# Patient Record
Sex: Male | Born: 1935 | Race: White | Hispanic: No | Marital: Single | Smoking: Never smoker
Health system: Southern US, Community
[De-identification: ages and names within clinical notes are randomized; demographics above are authoritative.]

## PROBLEM LIST (undated history)

## (undated) DIAGNOSIS — I10 Essential (primary) hypertension: Secondary | ICD-10-CM

## (undated) DIAGNOSIS — R0789 Other chest pain: Secondary | ICD-10-CM

## (undated) DIAGNOSIS — R000 Tachycardia, unspecified: Secondary | ICD-10-CM

## (undated) DIAGNOSIS — E7800 Pure hypercholesterolemia, unspecified: Secondary | ICD-10-CM

## (undated) DIAGNOSIS — H409 Unspecified glaucoma: Secondary | ICD-10-CM

## (undated) DIAGNOSIS — M1990 Unspecified osteoarthritis, unspecified site: Secondary | ICD-10-CM

## (undated) HISTORY — DX: Tachycardia, unspecified: R00.0

## (undated) HISTORY — DX: Pure hypercholesterolemia, unspecified: E78.00

## (undated) HISTORY — PX: APPENDECTOMY: SHX54

## (undated) HISTORY — DX: Essential (primary) hypertension: I10

## (undated) HISTORY — DX: Other chest pain: R07.89

## (undated) HISTORY — DX: Unspecified glaucoma: H40.9

## (undated) HISTORY — PX: HAND SURGERY: SHX662

## (undated) HISTORY — PX: TESTICLE REMOVAL: SHX68

## (undated) HISTORY — DX: Unspecified osteoarthritis, unspecified site: M19.90

---

## 1998-06-19 ENCOUNTER — Ambulatory Visit (HOSPITAL_COMMUNITY): Admission: RE | Admit: 1998-06-19 | Discharge: 1998-06-19 | Payer: Self-pay | Admitting: Gastroenterology

## 1998-08-08 ENCOUNTER — Ambulatory Visit (HOSPITAL_COMMUNITY): Admission: RE | Admit: 1998-08-08 | Discharge: 1998-08-08 | Payer: Self-pay | Admitting: Urology

## 1999-06-20 ENCOUNTER — Encounter: Admission: RE | Admit: 1999-06-20 | Discharge: 1999-06-20 | Payer: Self-pay | Admitting: Urology

## 1999-06-20 ENCOUNTER — Encounter: Payer: Self-pay | Admitting: Urology

## 1999-09-18 ENCOUNTER — Encounter: Admission: RE | Admit: 1999-09-18 | Discharge: 1999-09-18 | Payer: Self-pay | Admitting: Urology

## 1999-09-18 ENCOUNTER — Encounter: Payer: Self-pay | Admitting: Urology

## 1999-12-31 ENCOUNTER — Encounter: Admission: RE | Admit: 1999-12-31 | Discharge: 1999-12-31 | Payer: Self-pay | Admitting: Urology

## 1999-12-31 ENCOUNTER — Encounter: Payer: Self-pay | Admitting: Urology

## 2000-07-07 ENCOUNTER — Encounter: Payer: Self-pay | Admitting: Urology

## 2000-07-07 ENCOUNTER — Encounter: Admission: RE | Admit: 2000-07-07 | Discharge: 2000-07-07 | Payer: Self-pay | Admitting: Urology

## 2001-01-12 ENCOUNTER — Encounter: Admission: RE | Admit: 2001-01-12 | Discharge: 2001-01-12 | Payer: Self-pay | Admitting: Urology

## 2001-01-12 ENCOUNTER — Encounter: Payer: Self-pay | Admitting: Urology

## 2001-07-20 ENCOUNTER — Encounter: Payer: Self-pay | Admitting: Urology

## 2001-07-20 ENCOUNTER — Encounter: Admission: RE | Admit: 2001-07-20 | Discharge: 2001-07-20 | Payer: Self-pay | Admitting: Urology

## 2002-08-25 ENCOUNTER — Encounter (INDEPENDENT_AMBULATORY_CARE_PROVIDER_SITE_OTHER): Payer: Self-pay | Admitting: Specialist

## 2002-08-25 ENCOUNTER — Ambulatory Visit (HOSPITAL_BASED_OUTPATIENT_CLINIC_OR_DEPARTMENT_OTHER): Admission: RE | Admit: 2002-08-25 | Discharge: 2002-08-25 | Payer: Self-pay | Admitting: Orthopedic Surgery

## 2003-10-03 ENCOUNTER — Inpatient Hospital Stay (HOSPITAL_BASED_OUTPATIENT_CLINIC_OR_DEPARTMENT_OTHER): Admission: RE | Admit: 2003-10-03 | Discharge: 2003-10-03 | Payer: Self-pay | Admitting: Cardiology

## 2004-07-09 ENCOUNTER — Encounter (INDEPENDENT_AMBULATORY_CARE_PROVIDER_SITE_OTHER): Payer: Self-pay | Admitting: *Deleted

## 2004-07-09 ENCOUNTER — Ambulatory Visit (HOSPITAL_BASED_OUTPATIENT_CLINIC_OR_DEPARTMENT_OTHER): Admission: RE | Admit: 2004-07-09 | Discharge: 2004-07-09 | Payer: Self-pay | Admitting: Orthopedic Surgery

## 2004-07-09 ENCOUNTER — Ambulatory Visit (HOSPITAL_COMMUNITY): Admission: RE | Admit: 2004-07-09 | Discharge: 2004-07-09 | Payer: Self-pay | Admitting: Orthopedic Surgery

## 2008-06-27 HISTORY — PX: CARDIOVASCULAR STRESS TEST: SHX262

## 2010-01-08 ENCOUNTER — Encounter: Admission: RE | Admit: 2010-01-08 | Discharge: 2010-01-08 | Payer: Self-pay | Admitting: Urology

## 2010-08-30 NOTE — H&P (Signed)
NAME:  Joshua Santiago, Joshua Santiago                        ACCOUNT NO.:  [id number]   MEDICAL RECORD NO.:  [id number]                   PATIENT TYPE:  OIB   LOCATION:  6501                                 FACILITY:  [facility]   PHYSICIAN:  Peter M. Swaziland, M.D.               DATE OF BIRTH:  [birth year]   DATE OF ADMISSION:  10/03/2003  DATE OF DISCHARGE:                                HISTORY & PHYSICAL   HISTORY OF PRESENT ILLNESS:  Mr. Borak is a 77 year old white male who is  seen for evaluation of occasional chest pain.  This pain occurs across the  anterior precordium and is described as a dull ache.  There is no radiation,  nausea, vomiting, sweating or shortness of breath.  Sometimes, these  symptoms occur when he is quiet and at rest and may awaken him at night.  It  does not appear to be associated with exertion.  He reports a prior stress  test in 1996 which was unremarkable.  He does have a history of  hypercholesterolemia and family history of early coronary disease.  The  patient was seen for a stress echo evaluation on September 22, 2003.  He was able  to exercise for 9 minutes on the Bruce protocol and experienced no chest  pain.  His heart rate and blood pressure response were normal.  He had no  significant ST segment changes.  However, his stress echo was abnormal,  showing moderate hypokinesia of the mid-to-distal anterior septum and a  prominent septal bounce.  Because of his abnormal stress echo evaluation, it  was recommended that he undergo coronary angiography.   PAST MEDICAL HISTORY:  1. Hypothyroidism.  2. History of mesothelioma requiring removal of a testicle.   PAST SURGICAL HISTORY:  His prior surgeries include:  1. Tonsillectomy.  2. Appendectomy.  3. Varicose vein surgery.  4. Repair of Dupuytren's contractures in both hands.   ALLERGIES:  Allergies to PENICILLIN.   CURRENT MEDICATIONS:  1. Synthroid daily.  2. Cosopt eye drops b.i.d.  3. Aspirin 81 mg per  day.  4. Multivitamin daily.   SOCIAL HISTORY:  The patient does some part-time Psychologist, prison and probation services work.  He  denies tobacco use.  He drinks 2 mixed drinks per day.  He is married and  has 2 children.   FAMILY HISTORY:  Father died of suicide.  Mother died of cancer.  One  brother died at age 98 of myocardial infarction.  One brother is alive and  well.   REVIEW OF SYSTEMS:  The patient is fairly sedentary.  He has no history of  TIA or stroke.  No claudication.  No history of edema or PND.  Other review  of systems are negative.   PHYSICAL EXAM:  GENERAL:  On physical exam, the patient is a pleasant white  male in no apparent distress.  VITAL SIGNS:  Blood pressure is 124/80, pulse 70 and  regular, respirations  are normal.  HEENT:  Exam is unremarkable.  NECK:  He has no JVD or bruits.  LUNGS:  Lungs are clear.  CARDIAC:  Exam reveals a regular rate and rhythm without gallop, murmur, rub  or click.  ABDOMEN:  Abdomen is soft and nontender.  He has no hepatosplenomegaly,  masses or bruits.  EXTREMITIES:  Extremities are without edema.  Pulses are 2+ and symmetric.  NEUROLOGIC:  Exam is nonfocal.   LABORATORY DATA:  Chest x-ray shows no active disease.   Resting ECG shows a left anterior fascicular block, otherwise normal.   IMPRESSION:  1. Atypical chest pain but with an abnormal stress echocardiographic study.  2. Hypercholesterolemia.  3. Family history of coronary disease.  4. Hypothyroidism.   PLAN:  We will proceed with coronary angiography.                                                Peter M. Swaziland, M.D.    PMJ/MEDQ  D:  10/02/2003  T:  10/03/2003  Job:  [id number]   cc:   Veverly Fells. Altheimer, M.D.  [address]  [place]  [place] [address]  Fax: [phone]

## 2010-08-30 NOTE — Cardiovascular Report (Signed)
NAME:  Joshua Santiago, Joshua Santiago                        ACCOUNT NO.:  [id number]   MEDICAL RECORD NO.:  [id number]                   PATIENT TYPE:  OIB   LOCATION:  6501                                 FACILITY:  [facility]   PHYSICIAN:  Peter M. Swaziland, M.D.               DATE OF BIRTH:  1929-01-21   DATE OF PROCEDURE:  10/03/2003  DATE OF DISCHARGE:  10/03/2003                              CARDIAC CATHETERIZATION   INDICATION FOR PROCEDURE:  A 77 year old white male with history of atypical  chest pain.  He has history of hypercholesterolemia, family history of  coronary disease and elevated CRP level.  Recent stress echocardiogram was  suggestive of anteroseptal ischemia.   ACCESS:  Via the right femoral artery using standard Seldinger technique.   EQUIPMENT:  6 French 4 cm right and left Judkins catheter, 4 French pigtail  catheter, 4 French arterial sheath.   MEDICATIONS:  Local anesthesia with 1% Xylocaine.   CONTRAST:  100 mL of Omnipaque.   HEMODYNAMIC DATA:  Aortic pressure 126/72 with mean of 94.  Left ventricular  pressure was 137 with EDP of 13 mmHg.   ANGIOGRAPHIC DATA:  The left coronary artery arises and distributes  normally.  The left main coronary artery is normal.   The left anterior descending artery is a large vessel which wraps around the  apex and supplies the distal inferior wall and is also normal.   There is intermediate vessel which is normal.   The left circumflex coronary artery gives rise to two marginal vessels and  it is normal.   The right coronary artery arises at an odd angle.  It was entered with [facility]  catheter.  It is a co-dominant vessel and appears normal.   LEFT VENTRICULAR ANGIOGRAPHY:  In the RAO view demonstrates normal left  ventricular size and contractility with normal systolic function.  Ejection  fraction is estimated at 60%.  There is no mitral regurgitation or prolapse.   FINAL INTERPRETATION:  1. Normal coronary anatomy.  2. Normal  left ventricular function.                                               Peter M. Swaziland, M.D.    PMJ/MEDQ  D:  10/03/2003  T:  10/04/2003  Job:  [id number]   cc:   Veverly Fells. Altheimer, M.D.  [address], [address]  [place]  [place] [address]  Fax: [phone]   Bertram Millard. Dahlstedt, M.D.  [address]  [place]  [place] [address]  Fax: [phone]

## 2010-08-30 NOTE — Op Note (Signed)
NAMEMICHEIL, KLAUS              ACCOUNT NO.:  [id number]   MEDICAL RECORD NO.:  [id number]          PATIENT TYPE:  AMB   LOCATION:  DSC                          FACILITY:  [facility]   PHYSICIAN:  Katy Fitch. Sypher Montez Hageman., M.D.DATE OF BIRTH:  [birth year]   DATE OF PROCEDURE:  07/09/2004  DATE OF DISCHARGE:                                 OPERATIVE REPORT   PREOPERATIVE DIAGNOSIS:  Chronic Dupuytren's contracture right hand  involving the palm, pretendinous fibers to long and ring finger and  extensive nodular disease over P1 and P2 segments of a right ring finger and  a thumb index web space contracture involving primarily the natatory  ligaments at the first web.   POSTOPERATIVE DIAGNOSIS:  Chronic Dupuytren's contracture right hand  involving the palm, pretendinous fibers to long and ring finger and  extensive nodular disease over P1 and P2 segments of a right ring finger and  a thumb index web space contracture involving primarily the natatory  ligaments at the first web.   OPERATION:  1.  Resection of Dupuytren's contracture from right palm with pretendinous      fiber resection to long and ring fingers and resection of extensive      pathologic fascia from the P1 and P2 segments of the right ring finger.  2.  Resection of the natatory ligaments and pathologic fascia from the right      hand first web space.   OPERATING SURGEON:  Katy Fitch. Sypher, M.D.   ASSISTANT:  Marveen Reeks. Dasnoit, P.A.C.   ANESTHESIA:  General by LMA.   SUPERVISING ANESTHESIOLOGIST:  Dr. Claybon Jabs.   INDICATIONS:  Shaune Westfall is a 77 year old gentleman referred by Dr.  Casimiro Needle Altheimer and Peter Swaziland, M.D.  for evaluation and management of a  chronic right-hand Dupuytren's' contracture.   He is status post extensive surgery on the left hand for the same  predicament.   Mr. Cripps  is intimately familiar with the pathology of Dupuytren's'  disease, surgery and aftercare having undergone prior  surgery on several  occasions.   Preoperatively, he presented requesting that we resect the pathologic fascia  from his right ring finger and palm as well as any other potential sites of  involvement.   We advised him to proceed with resection of disease from the palm, ring  finger and first web space.   He fully understands that Dupuytren's' disease progresses over time and that  other nodules and course will form in adjacent fingers and even in the  fingers that have undergone resection.   He understands the potential complications of neurovascular injury, possible  wound infection and recurrence of contracture.   Questions were invited and answered preoperatively.   After informed consent, he is brought to the operating room at this time  anticipating right hand surgery.   PROCEDURE:  Terrilee Files was brought to the operating room and placed  supine position on the operating table.   Following the induction of general anesthesia by LMA under the direct  supervision of Adonis Huguenin, M.D., the right arm was prepped with Betadine  soap solution,  sterilely draped. A pneumatic tourniquet was applied to the  proximal right brachium.   Vancomycin 1 g was slowly administered over 90 minutes in the holding area  and in the preoperative period.   The right arm was then exsanguinated with Esmarch bandage and the arterial  tourniquet on the proximal brachium inflated to 250 mmHg.   Procedure commenced with planning a Brunner zigzag incisions, anticipating  skin removal at the site of the large nodule at the MP flexion crease.   The skin incisions were taken sharply down to the plane of the dermal  Dupuytren's junction.   The skin flaps were carefully elevated with the beaver blade and fine  tenotomy scissors dissection, elevating flaps of the level of the carpal  canal distally to the distal P2 segment of finger.   The common digital nerves and vessels were identified at the  level of the  mid palm and dissected distally with removal of all pathologic fascia  extending from the mid palmar space distally to the level of the A4 pulley.   After complete resection of numerous nodules, spiral bands and central cord  elements, the finger had full extension at the IP joints and 25 degrees of  hyperextension at the MP joint.   All bleeding points were cauterized with bipolar forceps during dissection.  Care was taken to preserve as many cutaneous perforating branches of both  the nerves and vessels.   Attention then directed to the first web space. A transverse incision was  fashioned directly over the palpable cord. Subcutaneous tissues were  carefully divided, taking care to spare the radial proper digital nerve and  artery to the index finger and carefully isolate the natatory ligament  between the thumb and index webspace. A segment measuring more than 1 cm in  length was resected relieving the first web contracture.   The tourniquet released and bleeding controlled by direct pressure and  electrocautery. The wounds were then repaired with corner sutures of 5-0  nylon and vertical and horizontal mattress sutures of 5-0 nylon.   There was 1.5  sq cm of skin excised at  the level of the proximal finger  flexion crease and MP joint with the use of a V-Y advancement flap technique  properly reapportioning skin after resection of the expanded skin due to the  extensive Dupuytren's' nodule formally present.   There was extensive involvement of the dermal portion of the skin at the  level of the proximal finger flexion crease and recurrence at this level  might be a possibility that would require full-thickness skin grafting.   Mr. Telford had immediate capillary refill to his thumb and fingers upon  tourniquet release. Marcaine 0.25% was infiltrated along the wound margins  for postoperative analgesia, taking care to aspirate prior to injection.  The wound was  then dressed with Silvadene, Xeroflo, sterile gauze, sterile  Kerlix and a voluminous dressing with a volar plaster splint stabilizing the  wrist at neutral and the ring finger in extension at the MP joint.   There were no apparent complications. Mr. Amory was transferred to the  recovery room with stable vital signs. He will be discharged home with a  prescription for Levaquin 500 mg one p.o. daily x4 days as a prophylactic  antibiotic. He has Percocet at home and is provided a supplemental  prescription of Motrin 600 mg one p.o. q.6h. p.r.n. pain with food.      RVS/MEDQ  D:  07/09/2004  T:  07/09/2004  Job:  [id number]   cc:   Veverly Fells. Altheimer, M.D.  [address]  [place]  [place] [address]  Fax: [phone]   Peter M. Swaziland, M.D.  [address]  [place], [place] [address]  Fax: [phone]

## 2010-08-30 NOTE — Op Note (Signed)
NAME:  Joshua Santiago, Joshua Santiago                        ACCOUNT NO.:  [id number]   MEDICAL RECORD NO.:  [id number]                   PATIENT TYPE:  AMB   LOCATION:  DSC                                  FACILITY:  [facility]   PHYSICIAN:  Katy Fitch. Naaman Plummer., M.D.          DATE OF BIRTH:  08-Jun-1928   DATE OF PROCEDURE:  08/25/2002  DATE OF DISCHARGE:                                 OPERATIVE REPORT   PREOPERATIVE DIAGNOSIS:  Progressive Dupuytren's contracture, left palm and  small finger with 75 degree flexion contracture of small finger proximal  interphalangeal joint.   POSTOPERATIVE DIAGNOSIS:  Progressive Dupuytren's contracture, left palm and  small finger with 75 degree flexion contracture of small finger proximal  interphalangeal joint.   OPERATION PERFORMED:  Excision of Dupuytren's pathologic fascia from left  small finger and palm, relieving flexion contractures of MP and PIP joints  of left small finger.   SURGEON:  Katy Fitch. Sypher, M.D.   ASSISTANT:  Jonni Sanger, P.A.   ANESTHESIA:  General by LMA.   SUPERVISING ANESTHESIOLOGIST:  Janetta Hora. Gelene Mink, M.D.   INDICATIONS FOR PROCEDURE:  The patient is a 77 year old with a history of  Dupuytren's diaphysis.  He has had involvement of his feet as well as his  hands.  He is status post successful surgery bilaterally with excellent  relief of disease in his long and ring fingers on the left.  He did not have  disease in the small fingers at the time of his previous surgery but has now  developed significant involvement of the lateral fascial sheath of the small  finger with significant contracture of the MP and PIP joints as well as  puckering in the palm due to pretendinous disease.  He requested surgical  excision.  His primary care physician is Veverly Fells. Altheimer, M.D.  He has  been cleared for surgery. After informed consent, he is brought to the  operating room at this time.   DESCRIPTION OF PROCEDURE:  The  patient was brought to the operating room and  placed in supine position on the operating table.  Following induction of  general anesthesia by LMA, the left arm was prepped with Betadine soap and  solution and sterilely draped.  Following exsanguination of the limb with an  Esmarch bandage, an arterial tourniquet applied to .  The procedure  commenced with planning of a Brunner zigzag incision from the DIP flexion  crease proximally to the mid-palm.  An area of pitting in the palm was  excised with an elliptical excision of skin.   The procedure commenced with a sharp incision of the skin followed by  meticulous elevation of skin flaps at the level of the pathologic fascia  dermal interface.  Care was taken to identify the radial and ulnar proper  digital arteries and nerves to the finger.  Most of the lateral fascial  sheath involvement was ulnar to the  ulnar proper digital artery and nerve of  the left small finger.  There was extensive palmar disease in the region of  the pretendinous fibers to the small finger with extensive involvement of  the common digital vessel and nerve to the small and ring finger.   A meticulous dissection was accomplished sparing the lumbrical muscle as  well as the flexor sheath.  The area of pitting in the palm was fully  relieved.  The flaps were elevated and a meticulous resection of the lateral  fascial sheath connections between the abductor digiti minimi and the  lateral fascial sheath and a spiral band surrounding the ulnar proper  digital artery and nerve were meticulously excised.   The PIP joint flexion contractures were relieved to approximately 20 degrees  by simple excision of the fascia.  A release of Grayson's ligaments and some  other retrovascular cords allowed full extension of the PIP joint.  The DIP  joint was not involved.  Care was taken to check the lateral fascia sheath  on the radial aspect of the finger.  No cords or other  pathologic factor was  identified.   The tourniquet was released with immediate capillary refill to the  fingertip.  Bleeding points were electrocauterized with bipolar current  followed by VY advancement flap creation and closure of the wound resecting  redundant skin.  The flaps were closed with interrupted sutures of 5-0 nylon  utilizing corner suture technique and vertical mattress.   The wound was carefully dressed with Silvadene, Xeroflo, fluff gauze,  acrylic fluff, sterile Webril and a volar plaster splint maintaining the MP  and IP joints in full extension.  Turgor and capillary refill of the finger  was normal at the conclusion of the procedure.   The patient was awakened from his general anesthesia and transferred to  recovery room with stable vitals signs.  He will be observed in recovery  care center for approximately one hour and discharged to the care of his  family with prescriptions for Percocet 5 mg one or two tablets by mouth  every four to six hours as needed for pain.  Also Levaquin 500 mg one by  mouth every day for five days as a prophylactic antibiotic.                                                Katy Fitch Naaman Plummer., M.D.    RVS/MEDQ  D:  08/25/2002  T:  08/26/2002  Job:  [id number]

## 2010-12-20 ENCOUNTER — Other Ambulatory Visit: Payer: Self-pay | Admitting: Urology

## 2010-12-20 ENCOUNTER — Ambulatory Visit
Admission: RE | Admit: 2010-12-20 | Discharge: 2010-12-20 | Disposition: A | Discharge status: Home / Self Care | Payer: Self-pay | Source: Ambulatory Visit | Attending: Urology | Admitting: Urology

## 2010-12-20 DIAGNOSIS — C6290 Malignant neoplasm of unspecified testis, unspecified whether descended or undescended: Secondary | ICD-10-CM

## 2011-09-26 ENCOUNTER — Encounter: Payer: Self-pay | Admitting: *Deleted

## 2012-02-24 ENCOUNTER — Ambulatory Visit (INDEPENDENT_AMBULATORY_CARE_PROVIDER_SITE_OTHER): Payer: Medicare Other

## 2012-02-24 ENCOUNTER — Other Ambulatory Visit: Payer: Self-pay | Admitting: Urology

## 2012-02-24 DIAGNOSIS — C459 Mesothelioma, unspecified: Secondary | ICD-10-CM

## 2012-02-24 DIAGNOSIS — Z77090 Contact with and (suspected) exposure to asbestos: Secondary | ICD-10-CM

## 2012-06-09 ENCOUNTER — Encounter: Payer: Self-pay | Admitting: Cardiology

## 2021-04-24 ENCOUNTER — Encounter (HOSPITAL_COMMUNITY): Payer: Self-pay

## 2021-04-24 ENCOUNTER — Emergency Department (HOSPITAL_COMMUNITY): Payer: Medicare Other

## 2021-04-24 ENCOUNTER — Emergency Department (HOSPITAL_COMMUNITY)
Admission: EM | Admit: 2021-04-24 | Discharge: 2021-04-24 | Disposition: A | Discharge status: Skilled Nursing Facility | Payer: Medicare Other | Attending: Emergency Medicine | Admitting: Emergency Medicine

## 2021-04-24 DIAGNOSIS — Z79899 Other long term (current) drug therapy: Secondary | ICD-10-CM | POA: Insufficient documentation

## 2021-04-24 DIAGNOSIS — W19XXXA Unspecified fall, initial encounter: Secondary | ICD-10-CM

## 2021-04-24 DIAGNOSIS — T07XXXA Unspecified multiple injuries, initial encounter: Secondary | ICD-10-CM

## 2021-04-24 DIAGNOSIS — I10 Essential (primary) hypertension: Secondary | ICD-10-CM | POA: Insufficient documentation

## 2021-04-24 DIAGNOSIS — F1099 Alcohol use, unspecified with unspecified alcohol-induced disorder: Secondary | ICD-10-CM | POA: Insufficient documentation

## 2021-04-24 DIAGNOSIS — F1090 Alcohol use, unspecified, uncomplicated: Secondary | ICD-10-CM

## 2021-04-24 DIAGNOSIS — Z789 Other specified health status: Secondary | ICD-10-CM

## 2021-04-24 DIAGNOSIS — E039 Hypothyroidism, unspecified: Secondary | ICD-10-CM | POA: Insufficient documentation

## 2021-04-24 DIAGNOSIS — Z7901 Long term (current) use of anticoagulants: Secondary | ICD-10-CM | POA: Diagnosis not present

## 2021-04-24 DIAGNOSIS — S0003XA Contusion of scalp, initial encounter: Secondary | ICD-10-CM | POA: Insufficient documentation

## 2021-04-24 DIAGNOSIS — S0990XA Unspecified injury of head, initial encounter: Secondary | ICD-10-CM | POA: Diagnosis present

## 2021-04-24 DIAGNOSIS — S299XXA Unspecified injury of thorax, initial encounter: Secondary | ICD-10-CM | POA: Insufficient documentation

## 2021-04-24 DIAGNOSIS — W01198A Fall on same level from slipping, tripping and stumbling with subsequent striking against other object, initial encounter: Secondary | ICD-10-CM | POA: Diagnosis not present

## 2021-04-24 DIAGNOSIS — Z20822 Contact with and (suspected) exposure to covid-19: Secondary | ICD-10-CM | POA: Insufficient documentation

## 2021-04-24 LAB — I-STAT CHEM 8, ED
BUN: 15 mg/dL (ref 8–23)
Calcium, Ion: 1.12 mmol/L — ABNORMAL LOW (ref 1.15–1.40)
Chloride: 110 mmol/L (ref 98–111)
Creatinine, Ser: 1.2 mg/dL (ref 0.61–1.24)
Glucose, Bld: 88 mg/dL (ref 70–99)
HCT: 40 % (ref 39.0–52.0)
Hemoglobin: 13.6 g/dL (ref 13.0–17.0)
Potassium: 4.1 mmol/L (ref 3.5–5.1)
Sodium: 139 mmol/L (ref 135–145)
TCO2: 19 mmol/L — ABNORMAL LOW (ref 22–32)

## 2021-04-24 LAB — CBC
HCT: 39.6 % (ref 39.0–52.0)
Hemoglobin: 13.3 g/dL (ref 13.0–17.0)
MCH: 34.4 pg — ABNORMAL HIGH (ref 26.0–34.0)
MCHC: 33.6 g/dL (ref 30.0–36.0)
MCV: 102.3 fL — ABNORMAL HIGH (ref 80.0–100.0)
Platelets: 181 10*3/uL (ref 150–400)
RBC: 3.87 MIL/uL — ABNORMAL LOW (ref 4.22–5.81)
RDW: 13.2 % (ref 11.5–15.5)
WBC: 6.1 10*3/uL (ref 4.0–10.5)
nRBC: 0 % (ref 0.0–0.2)

## 2021-04-24 LAB — RESP PANEL BY RT-PCR (FLU A&B, COVID) ARPGX2
Influenza A by PCR: NEGATIVE
Influenza B by PCR: NEGATIVE
SARS Coronavirus 2 by RT PCR: NEGATIVE

## 2021-04-24 LAB — SAMPLE TO BLOOD BANK

## 2021-04-24 LAB — COMPREHENSIVE METABOLIC PANEL
ALT: 19 U/L (ref 0–44)
AST: 27 U/L (ref 15–41)
Albumin: 3.3 g/dL — ABNORMAL LOW (ref 3.5–5.0)
Alkaline Phosphatase: 79 U/L (ref 38–126)
Anion gap: 7 (ref 5–15)
BUN: 14 mg/dL (ref 8–23)
CO2: 20 mmol/L — ABNORMAL LOW (ref 22–32)
Calcium: 8.8 mg/dL — ABNORMAL LOW (ref 8.9–10.3)
Chloride: 110 mmol/L (ref 98–111)
Creatinine, Ser: 1.05 mg/dL (ref 0.61–1.24)
GFR, Estimated: >60 mL/min (ref >60)
Glucose, Bld: 91 mg/dL (ref 70–99)
Potassium: 4.1 mmol/L (ref 3.5–5.1)
Sodium: 137 mmol/L (ref 135–145)
Total Bilirubin: 0.6 mg/dL (ref 0.3–1.2)
Total Protein: 6 g/dL — ABNORMAL LOW (ref 6.5–8.1)

## 2021-04-24 LAB — URINALYSIS, ROUTINE W REFLEX MICROSCOPIC
Bilirubin Urine: NEGATIVE
Glucose, UA: NEGATIVE mg/dL
Hgb urine dipstick: NEGATIVE
Ketones, ur: NEGATIVE mg/dL
Leukocytes,Ua: NEGATIVE
Nitrite: NEGATIVE
Protein, ur: NEGATIVE mg/dL
Specific Gravity, Urine: 1.01 (ref 1.005–1.030)
pH: 5.5 (ref 5.0–8.0)

## 2021-04-24 LAB — PROTIME-INR
INR: 2.1 — ABNORMAL HIGH (ref 0.8–1.2)
Prothrombin Time: 23.2 seconds — ABNORMAL HIGH (ref 11.4–15.2)

## 2021-04-24 LAB — ETHANOL: Alcohol, Ethyl (B): 170 mg/dL — ABNORMAL HIGH (ref <10)

## 2021-04-24 LAB — LACTIC ACID, PLASMA: Lactic Acid, Venous: 2.2 mmol/L (ref 0.5–1.9)

## 2021-04-24 MED ORDER — IOHEXOL 300 MG/ML  SOLN
75.0000 mL | Freq: Once | INTRAMUSCULAR | Status: AC | PRN
  Administered 2021-04-24: 75 mL via INTRAVENOUS

## 2021-04-24 NOTE — ED Notes (Signed)
Pt ambulated with steady gait in room with staff assistance for safety.

## 2021-04-24 NOTE — ED Triage Notes (Signed)
Pt from [facility] via [place], on coumadin. A&O, ambulatory w walker at baseline. Fell twice day, assessed at facility after first fall, refused transport, 2nd fall prompted EMS.   Hx HTN, 160 palp HR 80

## 2021-04-24 NOTE — ED Notes (Signed)
[facility] called  

## 2021-04-24 NOTE — ED Provider Notes (Addendum)
[facility] EMERGENCY DEPARTMENT Provider Note   CSN: [id number] Arrival date & time: 04/24/21  0042     History  Chief Complaint  Patient presents with   Joshua Santiago is a 88 y.o. male.   Fall   88 year old male with a history of HLD, HTN, hypothyroidism, DVT previously on Xarelto now reportedly on Coumadin who presents to the emergency department after a fall.  The history is provided by EMS.  Patient presented as a level 2 trauma after falling twice.  Initially refused transport after the first fall and the second fall prompted EMS.  The patient fell at his facility this evening and landed on the back of his head resulting in a hematoma to the back of the head.  No loss of consciousness.  He is normally ambulatory with a walker at baseline.  He arrived at the [facility], ED GCS 15, ABC intact.  His primary complaint on arrival was a posterior headache.  He denied any other complaints.  Home Medications Prior to Admission medications   Medication Sig Start Date End Date Taking? Authorizing Provider  ammonium lactate (LAC-HYDRIN) 12 % lotion Apply 1 application topically at bedtime. 03/27/21  Yes [provider]  brimonidine (ALPHAGAN) 0.2 % ophthalmic solution Place 1 drop into both eyes 3 (three) times daily. 03/13/21  Yes [provider]  dorzolamide (TRUSOPT) 2 % ophthalmic solution Place 1 drop into both eyes 3 (three) times daily. 04/03/21  Yes [provider]  latanoprost (XALATAN) 0.005 % ophthalmic solution Place 1 drop into both eyes at bedtime. 07/16/16  Yes [provider]  levothyroxine (SYNTHROID) 50 MCG tablet Take 50 mcg by mouth daily.   Yes [provider]  Netarsudil Dimesylate 0.02 % SOLN Place 1 drop into both eyes at bedtime. 12/02/17  Yes [provider]  oxybutynin (DITROPAN-XL) 5 MG 24 hr tablet Take 5 mg by mouth daily. 04/22/21  Yes [provider]  rivaroxaban  (XARELTO) 10 MG TABS tablet Take 10 mg by mouth daily. 08/25/16  Yes [provider]  timolol (TIMOPTIC) 0.5 % ophthalmic solution Place 1 drop into both eyes 2 (two) times daily. 01/03/21  Yes [provider]  LAGEVRIO 200 MG CAPS capsule Take 4 capsules by mouth See admin instructions. Bid x 5 days Patient not taking: Reported on 04/24/2021 04/09/21   [provider]  tamsulosin (FLOMAX) 0.4 MG CAPS capsule Take 0.4 mg by mouth daily. Patient not taking: Reported on 04/24/2021 04/01/21   [provider]      Allergies    Sulfasalazine and Penicillins    Review of Systems   Review of Systems  All other systems reviewed and are negative.  Physical Exam Updated Vital Signs BP (!) 160/68 (BP Location: Right Arm)    Pulse 70    Temp 98 F (36.7 C) (Oral)    Resp 18    Ht 5\' 8"  (1.727 m)    Wt 81.6 kg    SpO2 100%    BMI 27.37 kg/m  Physical Exam Vitals and nursing note reviewed.  Constitutional:      Appearance: He is well-developed.     Comments: GCS 15, ABC intact  HENT:     Head: Normocephalic.     Comments: Hematoma to the posterior occipital scalp Eyes:     Conjunctiva/sclera: Conjunctivae normal.  Neck:     Comments: No midline tenderness to palpation of the cervical spine. ROM intact. Cardiovascular:  Rate and Rhythm: Normal rate and regular rhythm.     Heart sounds: No murmur heard. Pulmonary:     Effort: Pulmonary effort is normal. No respiratory distress.     Breath sounds: Normal breath sounds.  Chest:     Comments: Chest wall stable and non-tender to AP and lateral compression. Clavicles stable and non-tender to AP compression Abdominal:     Palpations: Abdomen is soft.     Tenderness: There is no abdominal tenderness.     Comments: Pelvis stable to lateral compression.  Musculoskeletal:     Cervical back: Neck supple.     Comments: No midline tenderness to palpation of the thoracic or lumbar spine. Extremities atraumatic with  intact ROM.   Skin:    General: Skin is warm and dry.  Neurological:     Mental Status: He is alert.     Comments: CN II-XII grossly intact. Moving all four extremities spontaneously and sensation grossly intact.    ED Results / Procedures / Treatments   Labs (all labs ordered are listed, but only abnormal results are displayed) Labs Reviewed  COMPREHENSIVE METABOLIC PANEL - Abnormal; Notable for the following components:      Result Value   CO2 20 (*)    Calcium 8.8 (*)    Total Protein 6.0 (*)    Albumin 3.3 (*)    All other components within normal limits  CBC - Abnormal; Notable for the following components:   RBC 3.87 (*)    MCV 102.3 (*)    MCH 34.4 (*)    All other components within normal limits  ETHANOL - Abnormal; Notable for the following components:   Alcohol, Ethyl (B) 170 (*)    All other components within normal limits  LACTIC ACID, PLASMA - Abnormal; Notable for the following components:   Lactic Acid, Venous 2.2 (*)    All other components within normal limits  PROTIME-INR - Abnormal; Notable for the following components:   Prothrombin Time 23.2 (*)    INR 2.1 (*)    All other components within normal limits  I-STAT CHEM 8, ED - Abnormal; Notable for the following components:   Calcium, Ion 1.12 (*)    TCO2 19 (*)    All other components within normal limits  RESP PANEL BY RT-PCR (FLU A&B, COVID) ARPGX2  URINALYSIS, ROUTINE W REFLEX MICROSCOPIC  SAMPLE TO BLOOD BANK    EKG None  Radiology CT HEAD WO CONTRAST  Result Date: 04/24/2021 CLINICAL DATA:  Polytrauma, blunt.  Fall.  On Coumadin. EXAM: CT HEAD WITHOUT CONTRAST TECHNIQUE: Contiguous axial images were obtained from the base of the skull through the vertex without intravenous contrast. RADIATION DOSE REDUCTION: This exam was performed according to the departmental dose-optimization program which includes automated exposure control, adjustment of the mA and/or kV according to patient size and/or  use of iterative reconstruction technique. COMPARISON:  None. FINDINGS: Brain: No acute intracranial abnormality. Specifically, no hemorrhage, hydrocephalus, mass lesion, acute infarction, or significant intracranial injury. Diffuse cerebral atrophy. Vascular: No hyperdense vessel or unexpected calcification. Skull: No acute calvarial abnormality. Sinuses/Orbits: Mucosal thickening in the right maxillary sinus and scattered ethmoid air cells. No air-fluid levels. Other: Posterior left scalp hematoma. IMPRESSION: No acute intracranial abnormality. Chronic sinusitis. Electronically Signed   By: Rolm Baptise M.D.   On: 04/24/2021 01:09   CT Chest W Contrast  Result Date: 04/24/2021 CLINICAL DATA:  Blunt chest trauma with a widened mediastinum on portable chest earlier today. EXAM: CT CHEST  WITH CONTRAST TECHNIQUE: Multidetector CT imaging of the chest was performed during intravenous contrast administration. RADIATION DOSE REDUCTION: This exam was performed according to the departmental dose-optimization program which includes automated exposure control, adjustment of the mA and/or kV according to patient size and/or use of iterative reconstruction technique. CONTRAST:  This information not provided by the technologist. COMPARISON:  Chest, abdomen and pelvis CT with contrast 01/26/2008 FINDINGS: Cardiovascular: The heart has mildly enlarged compared with the 2009 exam. There are increased scattered three-vessel coronary artery calcifications. There is no pericardial effusion. There is aortic atherosclerosis. The great vessels are within normal limits. There is homogeneous enhancement without evidence of dissection or penetrating ulcers. The root and ascending segment both ectatic both measuring 3.8 cm without aneurysm. Remainder is normal caliber. There is no significant central venous distension. The pulmonary trunk is within normal caliber limits but the main arteries are prominent with the left approaching 3 cm,  the right 3.2 cm indicating arterial hypertension. Mediastinum/Nodes: Small atrophic thyroid without visible lesion. No axillary or intrathoracic adenopathy is seen. The thoracic esophagus and trachea are unremarkable. There is no mediastinal hemorrhage or fluid collections, no pneumomediastinum. Lungs/Pleura: No pleural effusion, hemorrhage or pneumothorax. Slight chronic reticulated scarring at the extreme lung apices unchanged. There is a subpleural 2 mm chronic left lower lobe nodule on series 5 axial 90, stable. There are scattered linear scar-like opacities in both bases and mild posterior atelectasis, also seen previously. Chronic 6 mm right middle lobe base nodule is again noted and unchanged on series 5 axial 107. There is increased subpleural reticulation in both lung bases, consistent with chronic change without honeycombing. Upper Abdomen: No acute abnormality. Innumerable liver cysts are again noted, many showing peripheral wall calcifications not seen on the prior study. Largest is again in the caudate hepatic segment, incompletely visualized measuring 11 cm, previously 10 cm. Musculoskeletal: There is mild chronic-appearing anterior wedge deformity of the T3 vertebral body. There is osteopenia. Other vertebra are normal in heights. There is mild thoracic kyphosis. No displaced fracture of the ribs, sternum and visualized shoulder girdles is seen. There is mild left-sided gynecomastia. IMPRESSION: 1. No mediastinal fluid collections or hemorrhage. 2. Aortic atherosclerosis with 3.8 cm ectatic aortic root and ascending segment. 3. Mild cardiomegaly with three-vessel calcific CAD. 4. Chronic lung changes with increased subpleural reticulation in the bases. 5. Prominent main pulmonary arteries.  No central embolus. 6. Mild anterior wedging of the T3 vertebral body, appears chronic. There is no retropulsion. 7. Innumerable sizable hepatic cysts, largest in the caudate lobe. Some of the cysts have developed  wall calcifications compared to the prior study and many are larger than previously. Electronically Signed   By: Telford Nab M.D.   On: 04/24/2021 03:22   CT CERVICAL SPINE WO CONTRAST  Result Date: 04/24/2021 CLINICAL DATA:  Polytrauma, blunt.  Fall. EXAM: CT CERVICAL SPINE WITHOUT CONTRAST TECHNIQUE: Multidetector CT imaging of the cervical spine was performed without intravenous contrast. Multiplanar CT image reconstructions were also generated. RADIATION DOSE REDUCTION: This exam was performed according to the departmental dose-optimization program which includes automated exposure control, adjustment of the mA and/or kV according to patient size and/or use of iterative reconstruction technique. COMPARISON:  None. FINDINGS: Alignment: No subluxation Skull base and vertebrae: No acute fracture. No primary bone lesion or focal pathologic process. Soft tissues and spinal canal: No prevertebral fluid or swelling. No visible canal hematoma. Disc levels: Advanced bilateral degenerative facet disease, right greater than left. Degenerative disc disease most pronounced  in the lower cervical spine. Upper chest: No acute findings Other: None IMPRESSION: Degenerative disc and facet disease. No acute bony abnormality. Electronically Signed   By: Rolm Baptise M.D.   On: 04/24/2021 01:10   DG Pelvis Portable  Result Date: 04/24/2021 CLINICAL DATA:  Polytrauma. EXAM: PORTABLE PELVIS 1-2 VIEWS COMPARISON:  None. FINDINGS: The bones are osteopenic. There is no evidence of pelvic fracture or diastasis. No pelvic bone lesions are seen. There are mild degenerative changes of both hips. Vertebroplasty changes are seen at L3 and L4. IMPRESSION: Negative. Electronically Signed   By: Ronney Asters M.D.   On: 04/24/2021 01:09   DG Chest Port 1 View  Result Date: 04/24/2021 CLINICAL DATA:  Trauma. EXAM: PORTABLE CHEST 1 VIEW COMPARISON:  Chest radiograph dated 05/08/2020 FINDINGS: With no focal consolidation, pleural  effusion or pneumothorax. Mild cardiomegaly. There is widened appearance of mediastinum. If there is clinical concern for mediastinal injury, further evaluation with CT is recommended. No acute osseous pathology. IMPRESSION: Widened appearance of the mediastinum. If there is clinical concern for mediastinal injury, further evaluation with CT is recommended. Electronically Signed   By: Anner Crete M.D.   On: 04/24/2021 01:06    Procedures Procedures    Medications Ordered in ED Medications  iohexol (OMNIPAQUE) 300 MG/ML solution 75 mL (75 mLs Intravenous Contrast Given 04/24/21 0300)    ED Course/ Medical Decision Making/ A&P                           Medical Decision Making  88 year old male with a history of HLD, HTN, hypothyroidism, DVT previously on Xarelto now reportedly on Coumadin who presents to the emergency department after a fall.  The history is provided by EMS.  Patient presented as a level 2 trauma after falling twice.  Initially refused transport after the first fall and the second fall prompted EMS.  The patient fell at his facility this evening and landed on the back of his head resulting in a hematoma to the back of the head.  No loss of consciousness.  He is normally ambulatory with a walker at baseline.  He arrived at the [facility], ED GCS 15, ABC intact.  His primary complaint on arrival was a posterior headache.  He denied any other complaints.  On arrival, the patient was afebrile, hemodynamically stable, mildly hypertensive BP 150/86.  His physical exam was significant for a posterior scalp hematoma with no other significant abnormalities.  His laboratory work-up Interpreted by me was significant for a COVID-19 and influenza PCR that was negative, CMP with decreased albumin and total protein, decreased calcium likely due to decreased albumin, otherwise no significant electrolyte abnormalities with a mildly low bicarb at 20.  Urinalysis negative for UTI.  Lactic acid  nonspecifically mildly elevated to 2.2.  His ethanol level was found to be elevated at 170.  His INR was therapeutic at 2.1.  His CBC was without a leukocytosis or anemia.  His chest x-ray and pelvis x-ray did not reveal acute traumatic injuries and were interpreted by myself and radiology.  No evidence of rib fracture or pelvic ring injury.  No evidence of pneumothorax.   His CT head and cervical spine were negative for acute traumatic abnormalities.  Of note, his chest x-ray did reveal a widened appearance of the mediastinum with radiology recommending further evaluation with CT which was performed with contrast.  CT of the chest was interpreted by radiology with incidental  findings and revealed no mediastinal fluid collections or hemorrhage, mild aortic atherosclerosis with 3.8 cm ectatic aortic root and ascending segment.  Mild cardiomegaly with three-vessel CAD.  Prominent main pulmonary arteries with no evidence of PE.  Mild anterior wedging of T3 vertebral body which appears chronic.  The patient is not tender at this area.  No retropulsion.  Multiple hepatic cysts noted.  The patient was observed in the emergency department over the course of 7 hours.  On reassessment, he was ambulatory with assistance and at his neurologic baseline.  He did not appear clinically intoxicated.  He has no signs of traumatic injury at this time.  Overall stable for discharge back to his facility.      Final Clinical Impression(s) / ED Diagnoses Final diagnoses:  Trauma  Alcohol use  Fall, initial encounter    Rx / DC Orders ED Discharge Orders     None         Regan Lemming, MD 04/25/21 1447    Regan Lemming, MD 04/25/21 1447

## 2021-04-24 NOTE — ED Notes (Addendum)
Trauma Response Nurse Documentation   Joshua Santiago is a 88 y.o. male arriving to [facility] ED via EMS  On [place]. Trauma was activated as a Level 2 by ED charge RN based on the following trauma criteria Elderly patients > 65 with head trauma on anti-coagulation (excluding ASA). Trauma team at the bedside on patient arrival. Patient cleared for CT by Dr. Armandina Gemma. Patient to CT with team. GCS 15.  History   Past Medical History:  Diagnosis Date   Arthritis    Hx of   Chest pressure    Symptoms of mild and abnormal ECG, need to rule out CAD   Exercise-induced tachycardia    Episode of related to exertion, appears to be sinus tachycardia   Glaucoma    He has a hx of   Hypercholesterolemia    Apparent hx of in the past, untreated   Hypertension    currently untreated      Past Surgical History:  Procedure Laterality Date   APPENDECTOMY     CARDIOVASCULAR STRESS TEST  06-27-2008   EF 63%   HAND SURGERY     left and right hand   TESTICLE REMOVAL     for cancer       Initial Focused Assessment (If applicable, or please see trauma documentation): Alert and oriented male presents via EMS from assisted living, posterior head pain as a result of a fall, no reported LOC  CT's Completed:   CT Head and CT C-Spine   Interventions:  Portable chest and pelvis XRAY, CT head/c-spine, [place] J c-collar, IV start and trauma lab draw  Plan for disposition:  Anticipate Discharge home if CT chest WDL  Consults completed:  none at time of note.  Event Summary: Patient arrives via EMS from assisted living facility after a fall this evening resulting in posterior head pain. No LOC. Reports second fall today. Ambulatory with a walker, noncompliant and does not use it inside his dwelling. EMS reports that patient takes Coumadin, chart review reflects xarelto and no coumadin. Initial head ct/c spine ct negative for acute injuries. Portable chest xray with widened mediastinum, plan to CT  chest. Pending results at this time.  MTP Summary (If applicable): NA  Bedside handoff with ED RN Jinny Blossom.    Barry Faircloth O Lynsay Fesperman  Trauma Response RN  Please call TRN at [phone] for further assistance.

## 2021-04-24 NOTE — Discharge Instructions (Signed)
You were evaluated in the Emergency Department and after careful evaluation, we did not find any emergent condition requiring admission or further testing in the hospital.  Your exam/testing today was overall reassuring.  Your trauma imaging was negative for acute fracture or bleeding in your brain.  Please return to the Emergency Department if you experience any worsening of your condition.  Thank you for allowing [place] to be a part of your care.

## 2023-02-13 DEATH — deceased

## 2023-07-09 IMAGING — DX DG PORTABLE PELVIS
1 series · 1 of 1 positions shown · non-contrast
Comparison: None.

CLINICAL DATA: Polytrauma.

EXAM:
PORTABLE PELVIS 1-2 VIEWS

[pelvis]
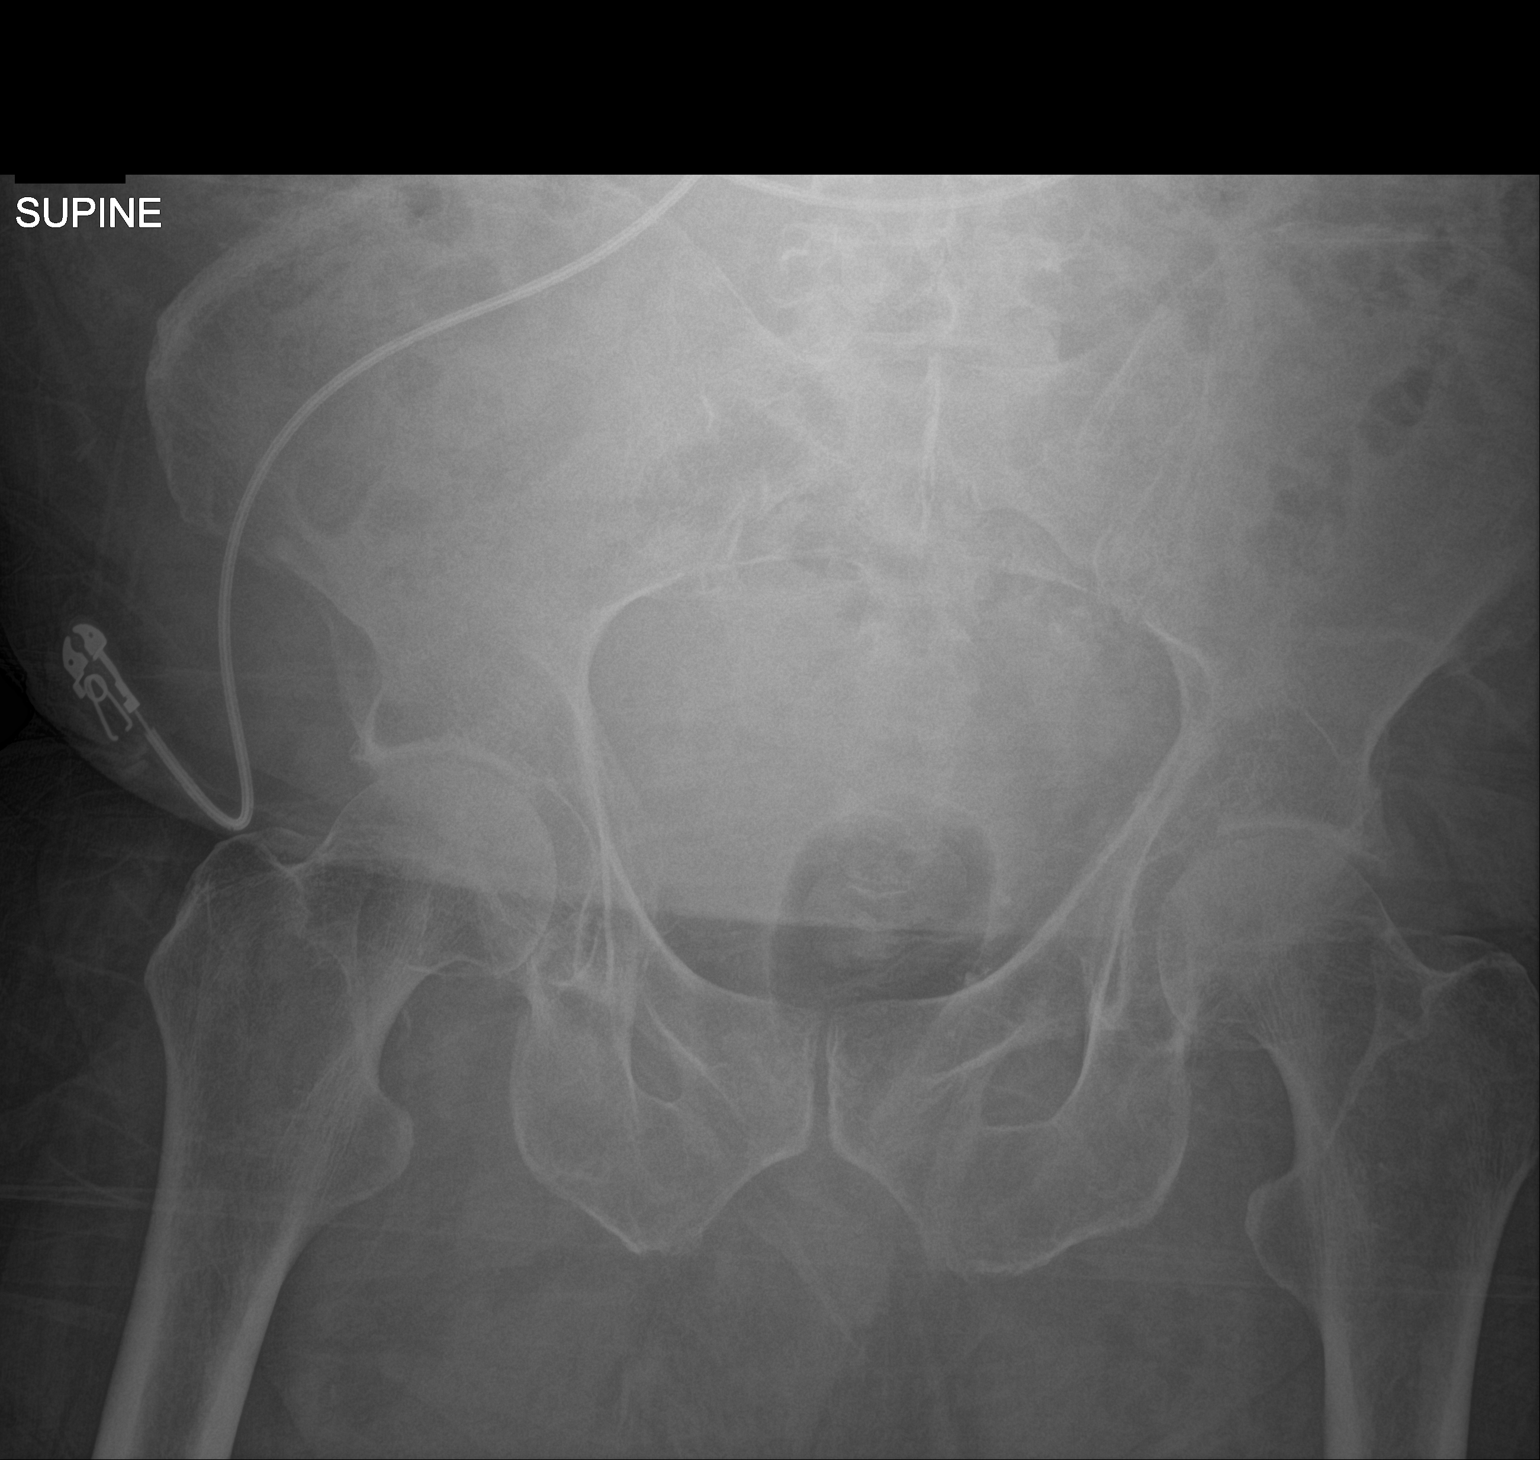

[1 of 1 positions shown; findings below may reference images not displayed]

FINDINGS: The bones are osteopenic. There is no evidence of pelvic fracture or
diastasis. No pelvic bone lesions are seen. There are mild
degenerative changes of both hips. Vertebroplasty changes are seen
at L3 and L4.
IMPRESSION: Negative.

## 2023-07-09 IMAGING — CT CT CHEST W/ CM
2 of 4 series · 14 of 36 positions shown, 17 images · IV contrast (APPLIED)
Comparison: Chest, abdomen and pelvis CT with contrast 01/26/2008

CLINICAL DATA: Blunt chest trauma with a widened mediastinum on
portable chest earlier today.

EXAM:
CT CHEST WITH CONTRAST
TECHNIQUE: Multidetector CT imaging of the chest was performed during
intravenous contrast administration.

[Series 3: chest w · axial · 0.75mm/px · z∈[+1042,+1308]mm · 11 of 159 slices shown, 14 images]
[im 13/159  mediastinal]
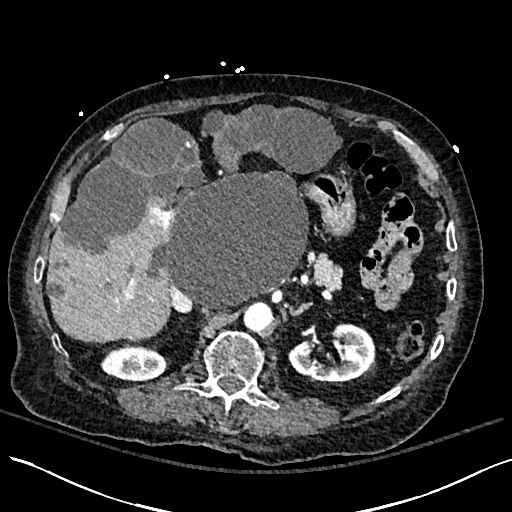
[im 13/159  lung]
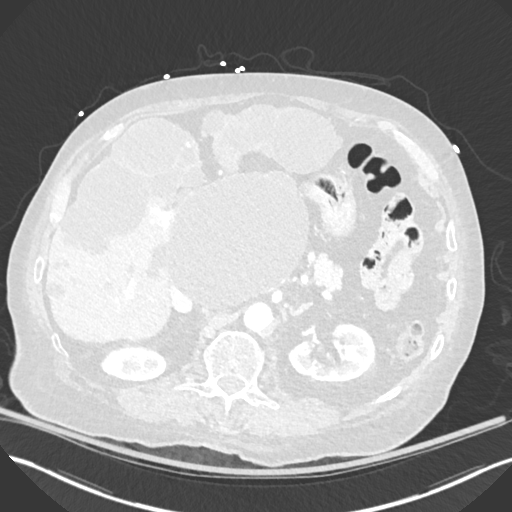
[im 25/159  lung]
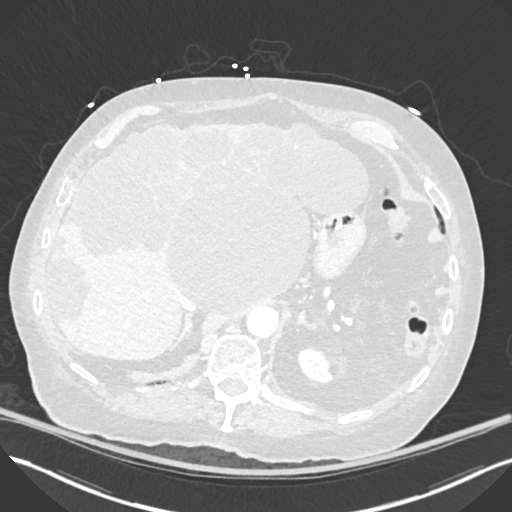
[im 37/159  lung]
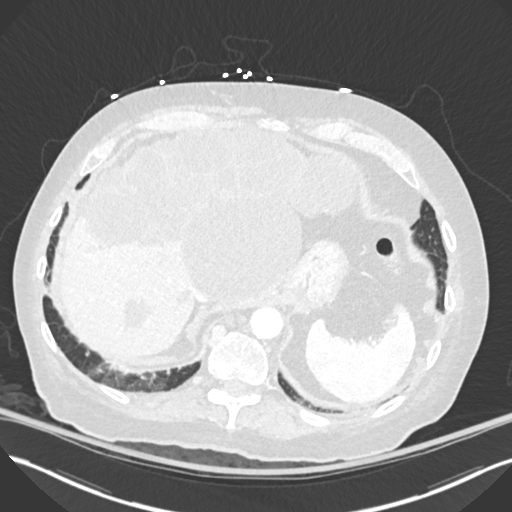
[im 49/159  lung]
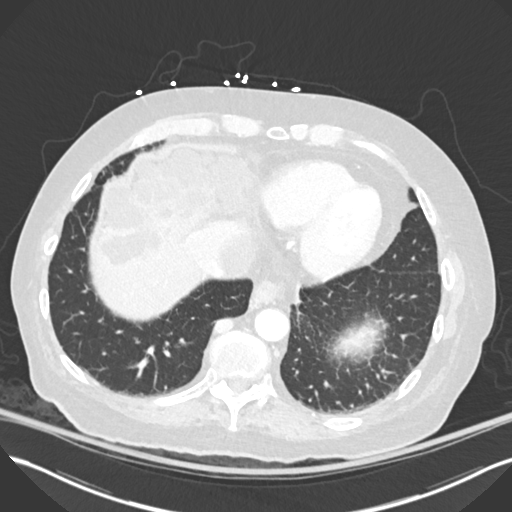
[im 61/159  mediastinal]
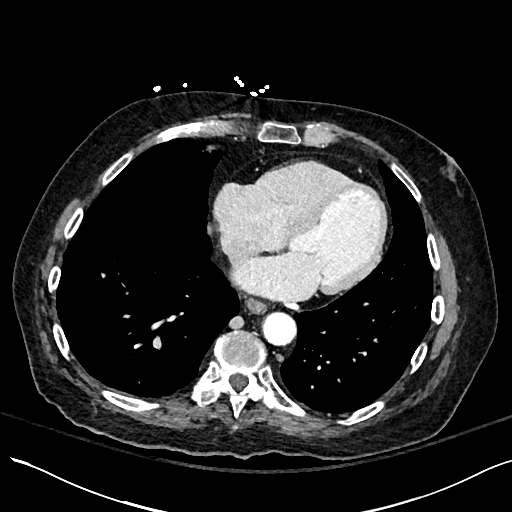
[im 61/159  lung]
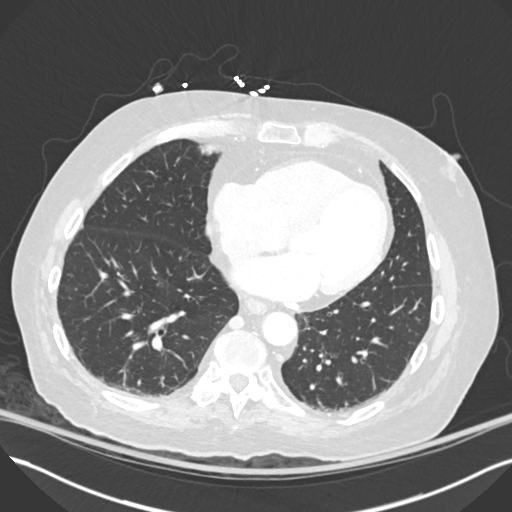
[im 86/159  lung]
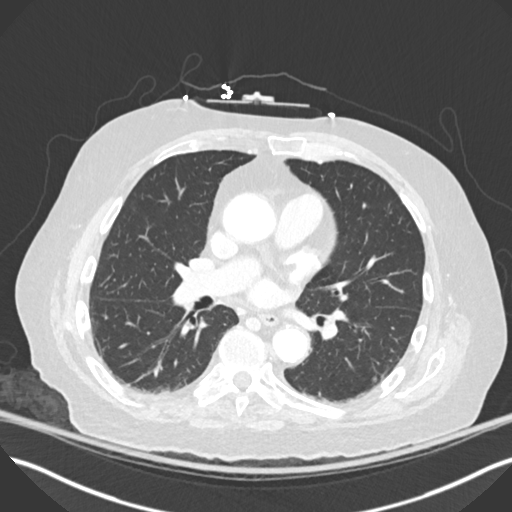
[im 98/159  lung]
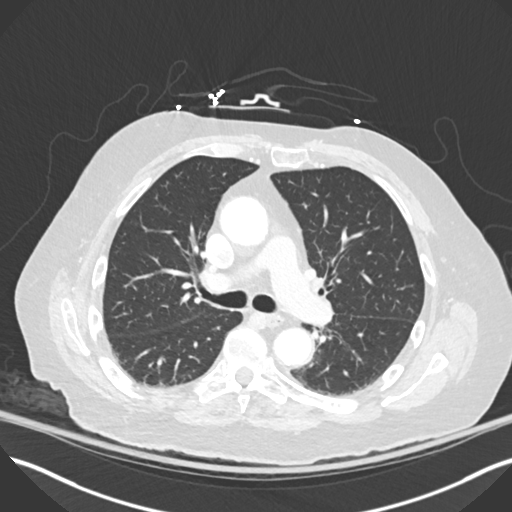
[im 110/159  lung]
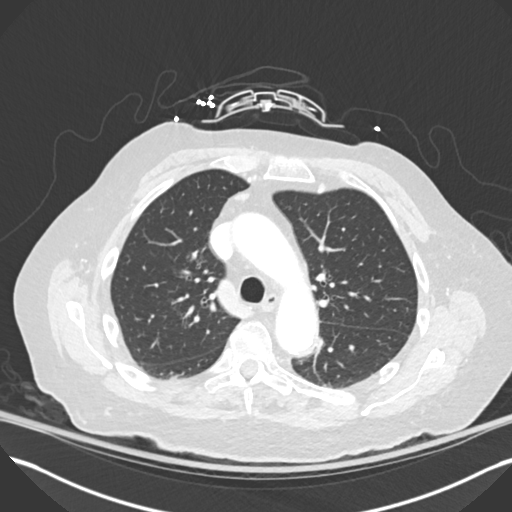
[im 122/159  mediastinal]
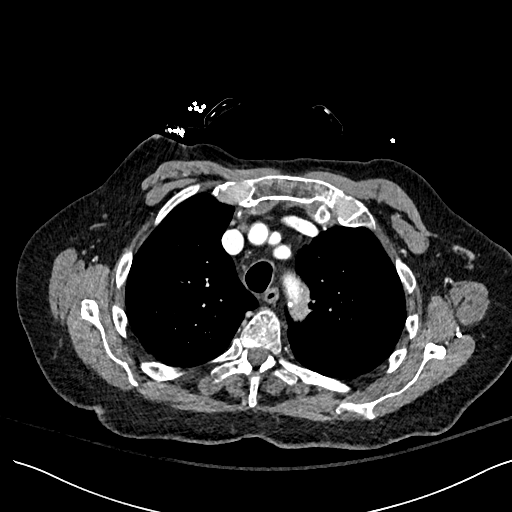
[im 122/159  lung]
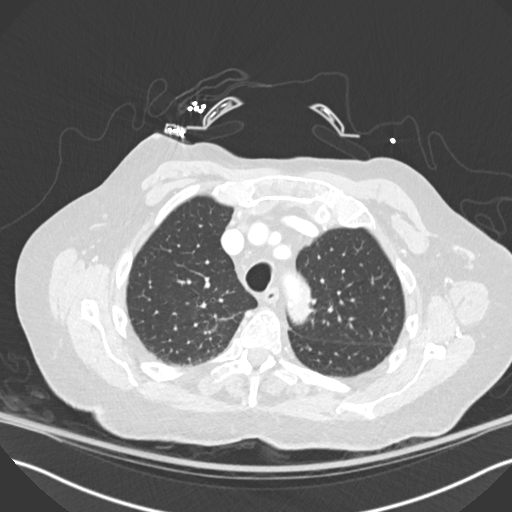
[im 134/159  lung]
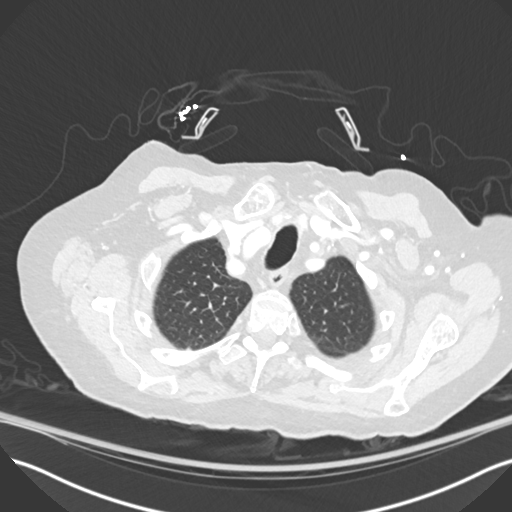
[im 146/159  lung]
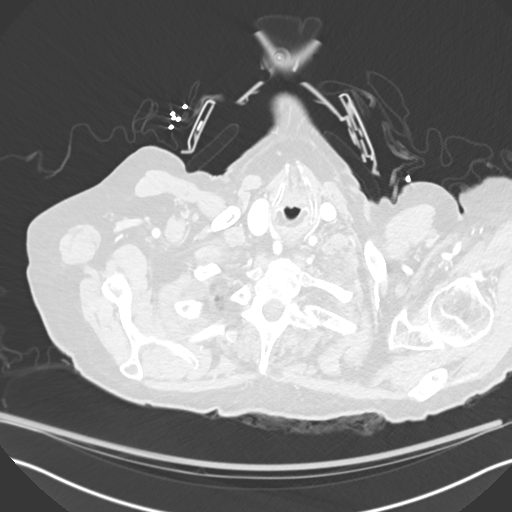

[Series 6: cor · coronal · 0.69mm/px · 3 of 144 slices shown]
[im 29/144  lung]
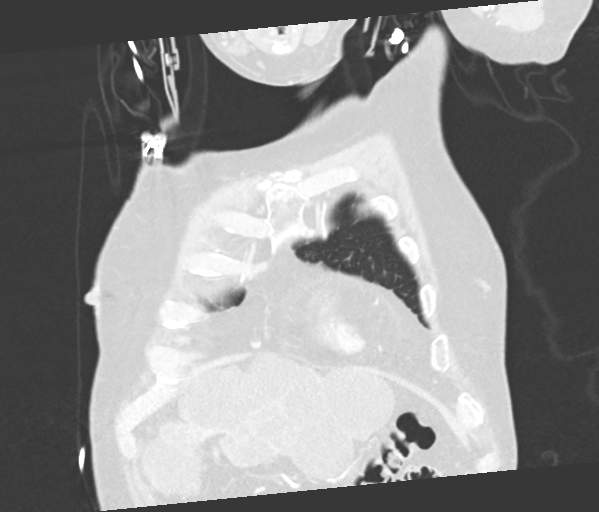
[im 58/144  lung]
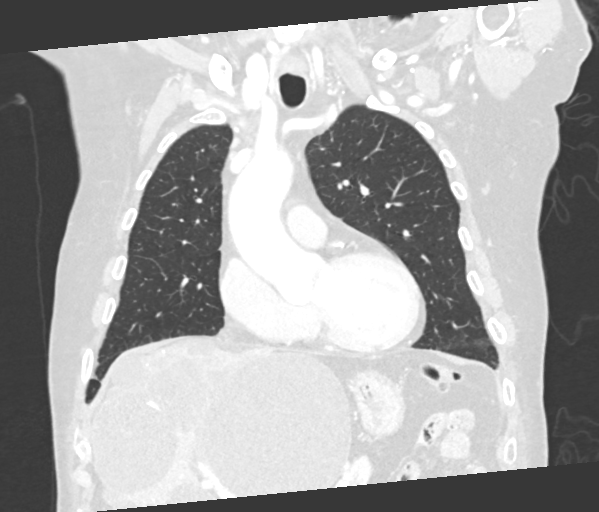
[im 86/144  lung]
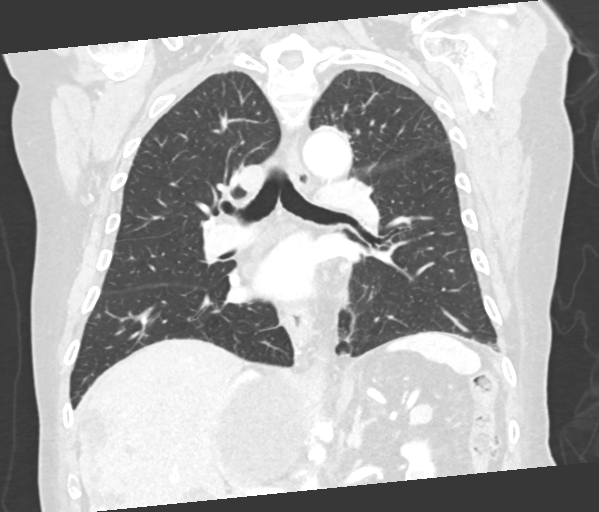

[14 of 36 positions shown; findings below may reference images not displayed]

RADIATION DOSE REDUCTION: This exam was performed according to the
departmental dose-optimization program which includes automated
exposure control, adjustment of the mA and/or kV according to
patient size and/or use of iterative reconstruction technique.

CONTRAST:  This information not provided by the technologist.
FINDINGS: Cardiovascular: The heart has mildly enlarged compared with the 5337
exam. There are increased scattered three-vessel coronary artery
calcifications. There is no pericardial effusion.

There is aortic atherosclerosis. The great vessels are within normal
limits. There is homogeneous enhancement without evidence of
dissection or penetrating ulcers. The root and ascending segment
both ectatic both measuring 3.8 cm without aneurysm. Remainder is
normal caliber. There is no significant central venous distension.

The pulmonary trunk is within normal caliber limits but the main
arteries are prominent with the left approaching 3 cm, the right
cm indicating arterial hypertension.

Mediastinum/Nodes: Small atrophic thyroid without visible lesion. No
axillary or intrathoracic adenopathy is seen. The thoracic esophagus
and trachea are unremarkable. There is no mediastinal hemorrhage or
fluid collections, no pneumomediastinum.

Lungs/Pleura: No pleural effusion, hemorrhage or pneumothorax.
Slight chronic reticulated scarring at the extreme lung apices
unchanged. There is a subpleural 2 mm chronic left lower lobe nodule
on series 5 axial 90, stable. There are scattered linear scar-like
opacities in both bases and mild posterior atelectasis, also seen
previously.

Chronic 6 mm right middle lobe base nodule is again noted and
unchanged on series 5 axial 107. There is increased subpleural
reticulation in both lung bases, consistent with chronic change
without honeycombing.

Upper Abdomen: No acute abnormality. Innumerable liver cysts are
again noted, many showing peripheral wall calcifications not seen on
the prior study. Largest is again in the caudate hepatic segment,
incompletely visualized measuring 11 cm, previously 10 cm.

Musculoskeletal: There is mild chronic-appearing anterior wedge
deformity of the T3 vertebral body. There is osteopenia. Other
vertebra are normal in heights. There is mild thoracic kyphosis.

No displaced fracture of the ribs, sternum and visualized shoulder
girdles is seen. There is mild left-sided gynecomastia.
IMPRESSION: 1. No mediastinal fluid collections or hemorrhage.
2. Aortic atherosclerosis with 3.8 cm ectatic aortic root and
ascending segment.
3. Mild cardiomegaly with three-vessel calcific CAD.
4. Chronic lung changes with increased subpleural reticulation in
the bases.
5. Prominent main pulmonary arteries.  No central embolus.
6. Mild anterior wedging of the T3 vertebral body, appears chronic.
There is no retropulsion.
7. Innumerable sizable hepatic cysts, largest in the caudate lobe.
Some of the cysts have developed wall calcifications compared to the
prior study and many are larger than previously.
